# Patient Record
Sex: Male | Born: 1985 | Race: White | Hispanic: No | Marital: Single | Smoking: Current every day smoker
Health system: Southern US, Community
[De-identification: ages and names within clinical notes are randomized; demographics above are authoritative.]

## PROBLEM LIST (undated history)

## (undated) DIAGNOSIS — I499 Cardiac arrhythmia, unspecified: Secondary | ICD-10-CM

---
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
## 2005-07-23 ENCOUNTER — Emergency Department (HOSPITAL_COMMUNITY): Admission: EM | Admit: 2005-07-23 | Discharge: 2005-07-23 | Payer: Self-pay | Admitting: Emergency Medicine

## 2005-10-04 ENCOUNTER — Inpatient Hospital Stay (HOSPITAL_COMMUNITY): Admission: AC | Admit: 2005-10-04 | Discharge: 2005-10-10 | Payer: Self-pay

## 2005-10-04 ENCOUNTER — Ambulatory Visit: Payer: Self-pay | Admitting: Physical Medicine & Rehabilitation

## 2005-12-12 ENCOUNTER — Encounter: Admission: RE | Admit: 2005-12-12 | Discharge: 2006-03-10 | Payer: Self-pay | Admitting: Neurosurgery

## 2005-12-26 ENCOUNTER — Ambulatory Visit: Payer: Self-pay | Admitting: Psychology

## 2006-10-10 ENCOUNTER — Ambulatory Visit (HOSPITAL_COMMUNITY): Admission: RE | Admit: 2006-10-10 | Discharge: 2006-10-10 | Payer: Self-pay | Admitting: Neurosurgery

## 2006-10-13 ENCOUNTER — Encounter: Admission: RE | Admit: 2006-10-13 | Discharge: 2007-01-11 | Payer: Self-pay | Admitting: Neurosurgery
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
## 2007-06-01 ENCOUNTER — Emergency Department (HOSPITAL_COMMUNITY): Admission: EM | Admit: 2007-06-01 | Discharge: 2007-06-02 | Payer: Self-pay | Admitting: Emergency Medicine

## 2007-07-09 ENCOUNTER — Ambulatory Visit: Payer: Self-pay | Admitting: Dentistry

## 2007-07-09 ENCOUNTER — Encounter: Admission: AD | Admit: 2007-07-09 | Discharge: 2007-07-09 | Payer: Self-pay | Admitting: Dentistry

## 2007-07-22 ENCOUNTER — Ambulatory Visit (HOSPITAL_COMMUNITY): Admission: RE | Admit: 2007-07-22 | Discharge: 2007-07-22 | Payer: Self-pay | Admitting: Dentistry

## 2007-08-26 ENCOUNTER — Ambulatory Visit: Payer: Self-pay | Admitting: Dentistry

## 2007-10-01 ENCOUNTER — Ambulatory Visit: Payer: Self-pay | Admitting: Dentistry
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
## 2011-07-03 LAB — BASIC METABOLIC PANEL
BUN: 5 — ABNORMAL LOW
Calcium: 9.4
Chloride: 103
Creatinine, Ser: 1.04
GFR calc Af Amer: >60
GFR calc non Af Amer: >60
Glucose, Bld: 93

## 2011-07-03 LAB — PROTIME-INR: Prothrombin Time: 14

## 2011-07-03 LAB — CBC
HCT: 43.9
MCV: 100.3 — ABNORMAL HIGH
RDW: 12.8

## 2011-07-03 LAB — APTT: aPTT: 33

## 2011-07-05 LAB — DIFFERENTIAL
Basophils Absolute: 0
Basophils Relative: 0
Eosinophils Absolute: 0
Eosinophils Relative: 0
Lymphocytes Relative: 7 — ABNORMAL LOW
Lymphs Abs: 1.4
Monocytes Absolute: 1.5 — ABNORMAL HIGH
Monocytes Relative: 8
Neutro Abs: 16.7 — ABNORMAL HIGH
Neutrophils Relative %: 85 — ABNORMAL HIGH

## 2011-07-05 LAB — URINALYSIS, ROUTINE W REFLEX MICROSCOPIC
Glucose, UA: NEGATIVE
Hgb urine dipstick: NEGATIVE
Ketones, ur: 15 — AB
Nitrite: NEGATIVE
Protein, ur: NEGATIVE
Specific Gravity, Urine: 1.025
Urobilinogen, UA: 0.2
pH: 6

## 2011-07-05 LAB — COMPREHENSIVE METABOLIC PANEL
ALT: 33
Alkaline Phosphatase: 81
CO2: 20
Chloride: 105
Creatinine, Ser: 0.93
GFR calc Af Amer: >60

## 2011-07-05 LAB — LIPASE, BLOOD: Lipase: 15

## 2011-07-05 LAB — VALPROIC ACID LEVEL: Valproic Acid Lvl: <10 — ABNORMAL LOW

## 2011-07-05 LAB — CBC
HCT: 49.7
Hemoglobin: 17.4 — ABNORMAL HIGH
Platelets: 226
WBC: 19.7 — ABNORMAL HIGH

## 2011-07-05 LAB — POTASSIUM: Potassium: 4.1

## 2015-02-07 ENCOUNTER — Emergency Department (HOSPITAL_COMMUNITY): Payer: No Typology Code available for payment source

## 2015-02-07 ENCOUNTER — Emergency Department (HOSPITAL_COMMUNITY)
Admission: EM | Admit: 2015-02-07 | Discharge: 2015-02-07 | Disposition: A | Discharge status: Home / Self Care | Payer: No Typology Code available for payment source | Attending: Emergency Medicine | Admitting: Emergency Medicine

## 2015-02-07 ENCOUNTER — Encounter (HOSPITAL_COMMUNITY): Payer: Self-pay | Admitting: Emergency Medicine

## 2015-02-07 DIAGNOSIS — Z23 Encounter for immunization: Secondary | ICD-10-CM | POA: Diagnosis not present

## 2015-02-07 DIAGNOSIS — Y9389 Activity, other specified: Secondary | ICD-10-CM | POA: Diagnosis not present

## 2015-02-07 DIAGNOSIS — F1210 Cannabis abuse, uncomplicated: Secondary | ICD-10-CM | POA: Diagnosis not present

## 2015-02-07 DIAGNOSIS — Y998 Other external cause status: Secondary | ICD-10-CM | POA: Insufficient documentation

## 2015-02-07 DIAGNOSIS — Y92410 Unspecified street and highway as the place of occurrence of the external cause: Secondary | ICD-10-CM | POA: Insufficient documentation

## 2015-02-07 DIAGNOSIS — S3991XA Unspecified injury of abdomen, initial encounter: Secondary | ICD-10-CM | POA: Insufficient documentation

## 2015-02-07 DIAGNOSIS — S299XXA Unspecified injury of thorax, initial encounter: Secondary | ICD-10-CM | POA: Insufficient documentation

## 2015-02-07 DIAGNOSIS — S3992XA Unspecified injury of lower back, initial encounter: Secondary | ICD-10-CM | POA: Diagnosis not present

## 2015-02-07 HISTORY — DX: Cardiac arrhythmia, unspecified: I49.9

## 2015-02-07 LAB — COMPREHENSIVE METABOLIC PANEL
ALT: 21 U/L (ref 17–63)
ANION GAP: 11 (ref 5–15)
AST: 21 U/L (ref 15–41)
Albumin: 4.9 g/dL (ref 3.5–5.0)
Alkaline Phosphatase: 77 U/L (ref 38–126)
BILIRUBIN TOTAL: 0.7 mg/dL (ref 0.3–1.2)
BUN: 10 mg/dL (ref 6–20)
CHLORIDE: 103 mmol/L (ref 101–111)
CO2: 24 mmol/L (ref 22–32)
CREATININE: 1.16 mg/dL (ref 0.61–1.24)
Calcium: 10 mg/dL (ref 8.9–10.3)
Glucose, Bld: 97 mg/dL (ref 65–99)
Potassium: 3.6 mmol/L (ref 3.5–5.1)
Sodium: 138 mmol/L (ref 135–145)
Total Protein: 7.6 g/dL (ref 6.5–8.1)

## 2015-02-07 LAB — RAPID URINE DRUG SCREEN, HOSP PERFORMED
AMPHETAMINES: NOT DETECTED
BARBITURATES: NOT DETECTED
Benzodiazepines: NOT DETECTED
Cocaine: NOT DETECTED
Opiates: NOT DETECTED
TETRAHYDROCANNABINOL: POSITIVE — AB

## 2015-02-07 LAB — CBC
HEMATOCRIT: 46.6 % (ref 39.0–52.0)
HEMOGLOBIN: 17 g/dL (ref 13.0–17.0)
MCH: 34.5 pg — ABNORMAL HIGH (ref 26.0–34.0)
MCHC: 36.5 g/dL — AB (ref 30.0–36.0)
MCV: 94.5 fL (ref 78.0–100.0)
Platelets: 208 10*3/uL (ref 150–400)
RBC: 4.93 MIL/uL (ref 4.22–5.81)
RDW: 12.5 % (ref 11.5–15.5)
WBC: 11.2 10*3/uL — ABNORMAL HIGH (ref 4.0–10.5)

## 2015-02-07 LAB — SAMPLE TO BLOOD BANK

## 2015-02-07 LAB — CDS SEROLOGY

## 2015-02-07 LAB — PROTIME-INR
INR: 1.08 (ref 0.00–1.49)
Prothrombin Time: 14.1 seconds (ref 11.6–15.2)

## 2015-02-07 LAB — URINALYSIS, ROUTINE W REFLEX MICROSCOPIC
BILIRUBIN URINE: NEGATIVE
GLUCOSE, UA: NEGATIVE mg/dL
Hgb urine dipstick: NEGATIVE
KETONES UR: NEGATIVE mg/dL
Leukocytes, UA: NEGATIVE
NITRITE: NEGATIVE
PH: 6 (ref 5.0–8.0)
Protein, ur: NEGATIVE mg/dL
SPECIFIC GRAVITY, URINE: 1.008 (ref 1.005–1.030)
Urobilinogen, UA: 0.2 mg/dL (ref 0.0–1.0)

## 2015-02-07 LAB — ETHANOL: Alcohol, Ethyl (B): <5 mg/dL (ref <5)

## 2015-02-07 LAB — LIPASE, BLOOD: LIPASE: 26 U/L (ref 22–51)

## 2015-02-07 MED ORDER — TETANUS-DIPHTH-ACELL PERTUSSIS 5-2.5-18.5 LF-MCG/0.5 IM SUSP
0.5000 mL | Freq: Once | INTRAMUSCULAR | Status: DC
  Filled 2015-02-07: qty 0.5
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
## 2015-02-07 NOTE — Discharge Instructions (Signed)
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
## 2019-04-23 ENCOUNTER — Other Ambulatory Visit: Payer: Self-pay

## 2019-04-23 ENCOUNTER — Emergency Department (HOSPITAL_COMMUNITY)
Admission: EM | Admit: 2019-04-23 | Discharge: 2019-04-23 | Disposition: A | Discharge status: Home / Self Care | Payer: Medicare Other | Attending: Emergency Medicine | Admitting: Emergency Medicine

## 2019-04-23 ENCOUNTER — Encounter (HOSPITAL_COMMUNITY): Payer: Self-pay | Admitting: *Deleted

## 2019-04-23 DIAGNOSIS — R51 Headache: Secondary | ICD-10-CM | POA: Insufficient documentation

## 2019-04-23 DIAGNOSIS — B349 Viral infection, unspecified: Secondary | ICD-10-CM | POA: Diagnosis not present

## 2019-04-23 DIAGNOSIS — F17210 Nicotine dependence, cigarettes, uncomplicated: Secondary | ICD-10-CM | POA: Diagnosis not present

## 2019-04-23 DIAGNOSIS — I499 Cardiac arrhythmia, unspecified: Secondary | ICD-10-CM | POA: Diagnosis present

## 2019-04-23 LAB — COMPREHENSIVE METABOLIC PANEL
ALT: 19 U/L (ref 0–44)
AST: 14 U/L — ABNORMAL LOW (ref 15–41)
Albumin: 4.6 g/dL (ref 3.5–5.0)
Alkaline Phosphatase: 69 U/L (ref 38–126)
Anion gap: 11 (ref 5–15)
BUN: 8 mg/dL (ref 6–20)
CO2: 22 mmol/L (ref 22–32)
Calcium: 9.7 mg/dL (ref 8.9–10.3)
Chloride: 104 mmol/L (ref 98–111)
Creatinine, Ser: 0.88 mg/dL (ref 0.61–1.24)
GFR calc Af Amer: >60 mL/min (ref >60)
GFR calc non Af Amer: >60 mL/min (ref >60)
Glucose, Bld: 101 mg/dL — ABNORMAL HIGH (ref 70–99)
Potassium: 3.8 mmol/L (ref 3.5–5.1)
Sodium: 137 mmol/L (ref 135–145)
Total Bilirubin: 0.7 mg/dL (ref 0.3–1.2)
Total Protein: 7.3 g/dL (ref 6.5–8.1)

## 2019-04-23 LAB — CBC
HCT: 47.6 % (ref 39.0–52.0)
Hemoglobin: 17.2 g/dL — ABNORMAL HIGH (ref 13.0–17.0)
MCH: 34.7 pg — ABNORMAL HIGH (ref 26.0–34.0)
MCHC: 36.1 g/dL — ABNORMAL HIGH (ref 30.0–36.0)
MCV: 96.2 fL (ref 80.0–100.0)
Platelets: 213 10*3/uL (ref 150–400)
RBC: 4.95 MIL/uL (ref 4.22–5.81)
RDW: 12.2 % (ref 11.5–15.5)
WBC: 9.3 10*3/uL (ref 4.0–10.5)
nRBC: 0 % (ref 0.0–0.2)

## 2019-04-23 LAB — LIPASE, BLOOD: Lipase: 50 U/L (ref 11–51)

## 2019-04-23 MED ORDER — SODIUM CHLORIDE 0.9 % IV BOLUS
1000.0000 mL | Freq: Once | INTRAVENOUS | Status: AC
  Administered 2019-04-23: 1000 mL via INTRAVENOUS

## 2019-04-23 MED ORDER — ONDANSETRON 4 MG PO TBDP: 4.0000 mg | ORAL_TABLET | Freq: Once | ORAL | Status: DC | PRN

## 2019-04-23 MED ORDER — KETOROLAC TROMETHAMINE 30 MG/ML IJ SOLN
30.0000 mg | Freq: Once | INTRAMUSCULAR | Status: AC
  Administered 2019-04-23: 30 mg via INTRAVENOUS
  Filled 2019-04-23: qty 1

## 2019-04-23 MED ORDER — ACETAMINOPHEN 500 MG PO TABS
1000.0000 mg | ORAL_TABLET | Freq: Once | ORAL | Status: AC
Start: 2019-04-23 — End: 2019-04-23
  Administered 2019-04-23: 1000 mg via ORAL
  Filled 2019-04-23: qty 2

## 2019-04-23 MED ORDER — ONDANSETRON 8 MG PO TBDP: 8.0000 mg | ORAL_TABLET | Freq: Three times a day (TID) | ORAL | 0 refills | Status: AC | PRN

## 2019-04-23 MED ORDER — ONDANSETRON HCL 4 MG/2ML IJ SOLN
4.0000 mg | Freq: Once | INTRAMUSCULAR | Status: AC
  Administered 2019-04-23: 4 mg via INTRAVENOUS
  Filled 2019-04-23: qty 2

## 2019-04-23 MED ORDER — SODIUM CHLORIDE 0.9% FLUSH
3.0000 mL | Freq: Once | INTRAVENOUS | Status: AC
  Administered 2019-04-23: 3 mL via INTRAVENOUS

## 2019-04-23 NOTE — ED Triage Notes (Signed)
Pt states acute onset vomiting (x3), hot and cold chills, headache, photophobia.  He pulled a tick off yesterday and feels he was in the sun too much today.

## 2019-04-24 NOTE — ED Provider Notes (Signed)
[facility] EMERGENCY DEPARTMENT Provider Note   CSN: [id number] Arrival date & time: 04/23/19  1812     History   Chief Complaint Chief Complaint  Patient presents with  . Chills  . Emesis    HPI Steven Cisneros is a 33 y.o. male.     HPI Patient is a 33 year old male presents the emergency department with vomiting x3 as well as chills headache and photophobia today.  No neck pain or stiffness.  No rash.  He worked outside all day today believes that he may have been in the heat for too long.  No known sick contacts.  No known contacts with COVID-19 or patients under investigation for COVID-19. No sick family members. No other complaints at this time.  No ibuprofen or Tylenol tried as of yet for his headache.  No documented fever.  No rash.  No abdominal pain.  Denies diarrhea.  No blood in his vomit.  Symptoms are mild to moderate in severity.  Reports decreased oral intake today.  Past Medical History:  Diagnosis Date  . Irregular heartbeat     There are no active problems to display for this patient.   History reviewed. No pertinent surgical history.      Home Medications    Prior to Admission medications   Medication Sig Start Date End Date Taking? Authorizing Provider  ondansetron (ZOFRAN ODT) 8 MG disintegrating tablet Take 1 tablet (8 mg total) by mouth every 8 (eight) hours as needed for nausea or vomiting. 04/23/19   Jola Schmidt, MD    Family History No family history on file.  Social History Social History   Tobacco Use  . Smoking status: Current Every Day Smoker    Packs/day: 1.50    Types: Cigarettes  . Smokeless tobacco: Never Used  Substance Use Topics  . Alcohol use: No  . Drug use: No     Allergies   Codeine   Review of Systems Review of Systems  All other systems reviewed and are negative.    Physical Exam Updated Vital Signs BP 111/67 (BP Location: Left Arm)   Pulse 62   Temp 98.2 F (36.8 C) (Oral)    Resp 16   Ht 6\' 2"  (1.88 m)   Wt 80.7 kg   SpO2 98%   BMI 22.85 kg/m   Physical Exam Vitals signs and nursing note reviewed.  HENT:     Head: Normocephalic and atraumatic.  Neck:     Musculoskeletal: Normal range of motion and neck supple. No neck rigidity or muscular tenderness.  Cardiovascular:     Rate and Rhythm: Normal rate and regular rhythm.  Pulmonary:     Effort: Pulmonary effort is normal. No respiratory distress.     Breath sounds: Normal breath sounds.  Abdominal:     General: There is no distension.     Tenderness: There is no abdominal tenderness.  Musculoskeletal: Normal range of motion.  Lymphadenopathy:     Cervical: No cervical adenopathy.  Skin:    General: Skin is warm and dry.     Findings: No erythema or rash.  Neurological:     Mental Status: He is alert and oriented to person, place, and time.  Psychiatric:        Mood and Affect: Mood normal.      ED Treatments / Results  Labs (all labs ordered are listed, but only abnormal results are displayed) Labs Reviewed  COMPREHENSIVE METABOLIC PANEL - Abnormal; Notable for the  following components:      Result Value   Glucose, Bld 101 (*)    AST 14 (*)    All other components within normal limits  CBC - Abnormal; Notable for the following components:   Hemoglobin 17.2 (*)    MCH 34.7 (*)    MCHC 36.1 (*)    All other components within normal limits  SARS CORONAVIRUS 2  LIPASE, BLOOD    EKG None  Radiology No results found.  Procedures Procedures (including critical care time)  Medications Ordered in ED Medications  sodium chloride flush (NS) 0.9 % injection 3 mL (3 mLs Intravenous Given 04/23/19 2159)  sodium chloride 0.9 % bolus 1,000 mL (0 mLs Intravenous Stopped 04/23/19 2334)  ondansetron (ZOFRAN) injection 4 mg (4 mg Intravenous Given 04/23/19 2155)  acetaminophen (TYLENOL) tablet 1,000 mg (1,000 mg Oral Given 04/23/19 2158)  ketorolac (TORADOL) 30 MG/ML injection 30 mg (30 mg  Intravenous Given 04/23/19 2155)     Initial Impression / Assessment and Plan / ED Course  I have reviewed the triage vital signs and the nursing notes.  Pertinent labs & imaging results that were available during my care of the patient were reviewed by me and considered in my medical decision making (see chart for details).        Well-appearing.  Viral syndrome.  Feels much better after IV fluids and symptom management.  Discharged home in good condition.  Home with Zofran.  Tolerating oral fluids here in the emergency department.  Repeat abdominal exam without focal tenderness  Final Clinical Impressions(s) / ED Diagnoses   Final diagnoses:  Viral syndrome    ED Discharge Orders         Ordered    ondansetron (ZOFRAN ODT) 8 MG disintegrating tablet  Every 8 hours PRN     04/23/19 2317           Azalia Bilisampos, Rudy Luhmann, MD 04/24/19 2320

## 2022-01-02 ENCOUNTER — Other Ambulatory Visit: Payer: Self-pay | Admitting: Internal Medicine

## 2022-01-03 LAB — COMPLETE METABOLIC PANEL WITH GFR
AG Ratio: 2 (calc) (ref 1.0–2.5)
ALT: 8 U/L — ABNORMAL LOW (ref 9–46)
AST: 13 U/L (ref 10–40)
Albumin: 4.7 g/dL (ref 3.6–5.1)
Alkaline phosphatase (APISO): 78 U/L (ref 36–130)
BUN: 14 mg/dL (ref 7–25)
CO2: 23 mmol/L (ref 20–32)
Calcium: 10.5 mg/dL — ABNORMAL HIGH (ref 8.6–10.3)
Chloride: 103 mmol/L (ref 98–110)
Creat: 1.12 mg/dL (ref 0.60–1.26)
Globulin: 2.4 g/dL (calc) (ref 1.9–3.7)
Glucose, Bld: 87 mg/dL (ref 65–99)
Potassium: 4.2 mmol/L (ref 3.5–5.3)
Sodium: 138 mmol/L (ref 135–146)
Total Bilirubin: 0.4 mg/dL (ref 0.2–1.2)
Total Protein: 7.1 g/dL (ref 6.1–8.1)
eGFR: 87 mL/min/{1.73_m2} (ref >=60)

## 2022-01-03 LAB — CBC
HCT: 44.5 % (ref 38.5–50.0)
Hemoglobin: 15.9 g/dL (ref 13.2–17.1)
MCH: 35.3 pg — ABNORMAL HIGH (ref 27.0–33.0)
MCHC: 35.7 g/dL (ref 32.0–36.0)
MCV: 98.7 fL (ref 80.0–100.0)
MPV: 10.1 fL (ref 7.5–12.5)
Platelets: 211 10*3/uL (ref 140–400)
RBC: 4.51 10*6/uL (ref 4.20–5.80)
RDW: 12.7 % (ref 11.0–15.0)
WBC: 8.5 10*3/uL (ref 3.8–10.8)

## 2022-01-03 LAB — LIPID PANEL
Cholesterol: 179 mg/dL (ref <200)
HDL: 42 mg/dL (ref >=40)
LDL Cholesterol (Calc): 114 mg/dL (calc) — ABNORMAL HIGH
Non-HDL Cholesterol (Calc): 137 mg/dL (calc) — ABNORMAL HIGH (ref <130)
Total CHOL/HDL Ratio: 4.3 (calc) (ref <5.0)
Triglycerides: 123 mg/dL (ref <150)

## 2022-01-03 LAB — TSH: TSH: 1.88 mIU/L (ref 0.40–4.50)

## 2022-01-03 LAB — VITAMIN D 25 HYDROXY (VIT D DEFICIENCY, FRACTURES): Vit D, 25-Hydroxy: 34 ng/mL (ref 30–100)

## 2022-05-08 ENCOUNTER — Ambulatory Visit (INDEPENDENT_AMBULATORY_CARE_PROVIDER_SITE_OTHER): Payer: Medicare Other | Admitting: Cardiovascular Disease

## 2022-05-08 ENCOUNTER — Encounter: Payer: Self-pay | Admitting: Cardiovascular Disease

## 2022-05-08 ENCOUNTER — Ambulatory Visit (INDEPENDENT_AMBULATORY_CARE_PROVIDER_SITE_OTHER): Payer: Medicare Other

## 2022-05-08 DIAGNOSIS — Z720 Tobacco use: Secondary | ICD-10-CM

## 2022-05-08 DIAGNOSIS — R001 Bradycardia, unspecified: Secondary | ICD-10-CM

## 2022-05-08 DIAGNOSIS — R0789 Other chest pain: Secondary | ICD-10-CM | POA: Insufficient documentation

## 2022-05-08 DIAGNOSIS — Z8249 Family history of ischemic heart disease and other diseases of the circulatory system: Secondary | ICD-10-CM | POA: Diagnosis not present

## 2022-05-08 DIAGNOSIS — I700 Atherosclerosis of aorta: Secondary | ICD-10-CM
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
## 2022-05-08 NOTE — Patient Instructions (Signed)
Medication Instructions:  Your physician recommends that you continue on your current medications as directed. Please refer to the Current Medication list given to you today.  *If you need a refill on your cardiac medications before your next appointment, please call your pharmacy*   Testing/Procedures: Your physician has requested that you have an echocardiogram. Echocardiography is a painless test that uses sound waves to create images of your heart. It provides your doctor with information about the size and shape of your heart and how well your heart's chambers and valves are working. This procedure takes approximately one hour. There are no restrictions for this procedure. This procedure will be done at [facility], 2nd Floor   Dr. Allyson Sabal has ordered a CT coronary calcium score.   Test locations:   [facility], 2nd Floor)  This is $99 out of pocket.   Coronary CalciumScan A coronary calcium scan is an imaging test used to look for deposits of calcium and other fatty materials (plaques) in the inner lining of the blood vessels of the heart (coronary arteries). These deposits of calcium and plaques can partly clog and narrow the coronary arteries without producing any symptoms or warning signs. This puts a person at risk for a heart attack. This test can detect these deposits before symptoms develop. Tell a health care provider about: Any allergies you have. All medicines you are taking, including vitamins, herbs, eye drops, creams, and over-the-counter medicines. Any problems you or family members have had with anesthetic medicines. Any blood disorders you have. Any surgeries you have had. Any medical conditions you have. Whether you are pregnant or may be pregnant. What are the risks? Generally, this is a safe procedure. However, problems may occur, including: Harm to a pregnant woman and her unborn baby. This test involves the use of  radiation. Radiation exposure can be dangerous to a pregnant woman and her unborn baby. If you are pregnant, you generally should not have this procedure done. Slight increase in the risk of cancer. This is because of the radiation involved in the test. What happens before the procedure? No preparation is needed for this procedure. What happens during the procedure? You will undress and remove any jewelry around your neck or chest. You will put on a hospital gown. Sticky electrodes will be placed on your chest. The electrodes will be connected to an electrocardiogram (ECG) machine to record a tracing of the electrical activity of your heart. A CT scanner will take pictures of your heart. During this time, you will be asked to lie still and hold your breath for 2-3 seconds while a picture of your heart is being taken. The procedure may vary among health care providers and hospitals. What happens after the procedure? You can get dressed. You can return to your normal activities. It is up to you to get the results of your test. Ask your health care provider, or the department that is doing the test, when your results will be ready. Summary A coronary calcium scan is an imaging test used to look for deposits of calcium and other fatty materials (plaques) in the inner lining of the blood vessels of the heart (coronary arteries). Generally, this is a safe procedure. Tell your health care provider if you are pregnant or may be pregnant. No preparation is needed for this procedure. A CT scanner will take pictures of your heart. You can return to your normal activities after the scan is done. This information is  not intended to replace advice given to you by your health care provider. Make sure you discuss any questions you have with your health care provider. Document Released: 03/07/2008 Document Revised: 07/29/2016 Document Reviewed: 07/29/2016 Elsevier Interactive Patient Education  2017 [employer]  Christena Deem- Long Term Monitor Instructions  Your physician has requested you wear a ZIO patch monitor for 14 days.  This is a single patch monitor. [employer] supplies one patch monitor per enrollment. Additional stickers are not available. Please do not apply patch if you will be having a Nuclear Stress Test,  Echocardiogram, Cardiac CT, MRI, or Chest Xray during the period you would be wearing the  monitor. The patch cannot be worn during these tests. You cannot remove and re-apply the  ZIO XT patch monitor.  Your ZIO patch monitor will be mailed 3 day USPS to your address on file. It may take 3-5 days  to receive your monitor after you have been enrolled.  Once you have received your monitor, please review the enclosed instructions. Your monitor  has already been registered assigning a specific monitor serial # to you.  Billing and Patient Assistance Program Information  We have supplied [employer] with any of your insurance information on file for billing purposes. [employer] offers a sliding scale Patient Assistance Program for patients that do not have  insurance, or whose insurance does not completely cover the cost of the ZIO monitor.  You must apply for the Patient Assistance Program to qualify for this discounted rate.  To apply, please call [employer] at [phone], select option 4, select option 2, ask to apply for  Patient Assistance Program. Meredeth Ide will ask your household income, and how many people  are in your household. They will quote your out-of-pocket cost based on that information.  [employer] will also be able to set up a 45-month, interest-free payment plan if needed.  Applying the monitor   Shave hair from upper left chest.  Hold abrader disc by orange tab. Rub abrader in 40 strokes over the upper left chest as  indicated in your monitor instructions.  Clean area with 4 enclosed alcohol pads. Let dry.  Apply patch as indicated in monitor instructions. Patch will be  placed under collarbone on left  side of chest with arrow pointing upward.  Rub patch adhesive wings for 2 minutes. Remove white label marked "1". Remove the white  label marked "2". Rub patch adhesive wings for 2 additional minutes.  While looking in a mirror, press and release button in center of patch. A small green light will  flash 3-4 times. This will be your only indicator that the monitor has been turned on.  Do not shower for the first 24 hours. You may shower after the first 24 hours.  Press the button if you feel a symptom. You will hear a small click. Record Date, Time and  Symptom in the Patient Logbook.  When you are ready to remove the patch, follow instructions on the last 2 pages of Patient  Logbook. Stick patch monitor onto the last page of Patient Logbook.  Place Patient Logbook in the blue and white box. Use locking tab on box and tape box closed  securely. The blue and white box has prepaid postage on it. Please place it in the mailbox as  soon as possible. Your physician should have your test results approximately 7 days after the  monitor has been mailed back to [facility].  Call [facility] Customer Care at [phone]  if you have questions regarding  your ZIO XT patch monitor. Call them immediately if you see an orange light blinking on your  monitor.  If your monitor falls off in less than 4 days, contact our Monitor department at [phone].  If your monitor becomes loose or falls off after 4 days call [employer] at [phone] for  suggestions on securing your monitor    Follow-Up: At [facility], you and your health needs are our priority.  As part of our continuing mission to provide you with exceptional heart care, we have created designated Provider Care Teams.  These Care Teams include your primary Cardiologist (physician) and Advanced Practice Providers (APPs -  Physician Assistants and Nurse Practitioners) who all work together to provide you  with the care you need, when you need it.  We recommend signing up for the patient portal called "MyChart".  Sign up information is provided on this After Visit Summary.  MyChart is used to connect with patients for Virtual Visits (Telemedicine).  Patients are able to view lab/test results, encounter notes, upcoming appointments, etc.  Non-urgent messages can be sent to your provider as well.   To learn more about what you can do with MyChart, go to [email or web address].    Your next appointment:   We will see you on an as needed basis.  Provider:   Nanetta Batty, MD

## 2022-05-08 NOTE — Progress Notes (Unsigned)
Enrolled for [employer] to mail a ZIO XT long term holter monitor to the patients address on file.  

## 2022-05-08 NOTE — Assessment & Plan Note (Signed)
Patient occasionally gets chest pain when doing strenuous activity.  Given his risk factors of tobacco abuse and family history we will get a coronary calcium score to further evaluate

## 2022-05-08 NOTE — Assessment & Plan Note (Signed)
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  His heart rate today 64 in sinus rhythm.  I cannot elicit symptoms of dizziness or presyncope.  We will get a 2-week Zio patch to further evaluate

## 2022-05-08 NOTE — Assessment & Plan Note (Signed)
Mother has stents

## 2022-05-08 NOTE — Assessment & Plan Note (Signed)
Ongoing tobacco abuse of 2 packs a day for the last 25 years recalcitrant to resector modification.

## 2022-05-11 DIAGNOSIS — R001 Bradycardia, unspecified: Secondary | ICD-10-CM | POA: Diagnosis not present

## 2022-05-21 ENCOUNTER — Ambulatory Visit (HOSPITAL_BASED_OUTPATIENT_CLINIC_OR_DEPARTMENT_OTHER)
Admission: RE | Admit: 2022-05-21 | Discharge: 2022-05-21 | Disposition: A | Discharge status: Home / Self Care | Payer: Self-pay | Source: Ambulatory Visit | Attending: Cardiovascular Disease | Admitting: Cardiovascular Disease

## 2022-05-21 ENCOUNTER — Encounter (HOSPITAL_BASED_OUTPATIENT_CLINIC_OR_DEPARTMENT_OTHER): Payer: Self-pay

## 2022-05-21 ENCOUNTER — Ambulatory Visit (INDEPENDENT_AMBULATORY_CARE_PROVIDER_SITE_OTHER): Payer: Medicare Other

## 2022-05-21 DIAGNOSIS — Z8249 Family history of ischemic heart disease and other diseases of the circulatory system: Secondary | ICD-10-CM

## 2022-05-21 DIAGNOSIS — R0789 Other chest pain: Secondary | ICD-10-CM | POA: Insufficient documentation

## 2022-05-21 DIAGNOSIS — Z720 Tobacco use: Secondary | ICD-10-CM | POA: Insufficient documentation

## 2022-05-21 DIAGNOSIS — I700 Atherosclerosis of aorta: Secondary | ICD-10-CM | POA: Insufficient documentation

## 2022-05-21 DIAGNOSIS — R001 Bradycardia, unspecified: Secondary | ICD-10-CM | POA: Diagnosis not present

## 2022-05-21 LAB — ECHOCARDIOGRAM COMPLETE
Area-P 1/2: 4.15 cm2
S' Lateral: 3.37 cm

## 2023-04-24 ENCOUNTER — Other Ambulatory Visit: Payer: Self-pay | Admitting: Internal Medicine
